# Patient Record
Sex: Male | Born: 1975 | Hispanic: No | Marital: Married | Smoking: Former smoker
Health system: Southern US, Community
[De-identification: ages and names within clinical notes are randomized; demographics above are authoritative.]

## PROBLEM LIST (undated history)

## (undated) DIAGNOSIS — T7840XA Allergy, unspecified, initial encounter: Secondary | ICD-10-CM

## (undated) DIAGNOSIS — R319 Hematuria, unspecified: Secondary | ICD-10-CM

## (undated) HISTORY — PX: HAND SURGERY: SHX662

## (undated) HISTORY — PX: WISDOM TOOTH EXTRACTION: SHX21

## (undated) HISTORY — DX: Hematuria, unspecified: R31.9

## (undated) HISTORY — DX: Allergy, unspecified, initial encounter: T78.40XA

---
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
## 2006-10-27 ENCOUNTER — Emergency Department (HOSPITAL_COMMUNITY): Admission: EM | Admit: 2006-10-27 | Discharge: 2006-10-28 | Payer: Self-pay | Admitting: Emergency Medicine

## 2008-04-08 ENCOUNTER — Ambulatory Visit: Payer: Self-pay | Admitting: Family Medicine

## 2008-05-08 ENCOUNTER — Ambulatory Visit: Payer: Self-pay | Admitting: Family Medicine
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
## 2009-12-22 ENCOUNTER — Ambulatory Visit: Payer: Self-pay | Admitting: Family Medicine

## 2010-02-10 ENCOUNTER — Ambulatory Visit
Admission: RE | Admit: 2010-02-10 | Discharge: 2010-02-10 | Payer: Self-pay | Source: Home / Self Care | Attending: Family Medicine | Admitting: Family Medicine

## 2010-05-21 ENCOUNTER — Ambulatory Visit (INDEPENDENT_AMBULATORY_CARE_PROVIDER_SITE_OTHER): Payer: BC Managed Care – PPO | Admitting: Family Medicine

## 2010-05-21 DIAGNOSIS — M719 Bursopathy, unspecified: Secondary | ICD-10-CM

## 2010-11-15 ENCOUNTER — Inpatient Hospital Stay (INDEPENDENT_AMBULATORY_CARE_PROVIDER_SITE_OTHER)
Admission: RE | Admit: 2010-11-15 | Discharge: 2010-11-15 | Disposition: A | Discharge status: Home / Self Care | Payer: BC Managed Care – PPO | Source: Ambulatory Visit | Attending: Family Medicine | Admitting: Family Medicine

## 2010-11-15 DIAGNOSIS — S91009A Unspecified open wound, unspecified ankle, initial encounter: Secondary | ICD-10-CM

## 2010-11-15 DIAGNOSIS — S61209A Unspecified open wound of unspecified finger without damage to nail, initial encounter: Secondary | ICD-10-CM

## 2011-02-08 HISTORY — PX: SHOULDER SURGERY: SHX246

## 2011-03-02 ENCOUNTER — Encounter: Payer: Self-pay | Admitting: Internal Medicine

## 2011-03-10 ENCOUNTER — Encounter: Payer: Self-pay | Admitting: Family Medicine

## 2011-03-10 ENCOUNTER — Ambulatory Visit (INDEPENDENT_AMBULATORY_CARE_PROVIDER_SITE_OTHER): Payer: BC Managed Care – PPO | Admitting: Family Medicine

## 2011-03-10 VITALS — BP 110/70 | HR 61 | Ht 73.0 in | Wt 195.0 lb

## 2011-03-10 DIAGNOSIS — J301 Allergic rhinitis due to pollen: Secondary | ICD-10-CM

## 2011-03-10 DIAGNOSIS — Z0000 Encounter for general adult medical examination without abnormal findings: Secondary | ICD-10-CM

## 2011-03-10 NOTE — Progress Notes (Signed)
  Subjective:    Patient ID: Mario Taylor, male    DOB: 09-04-1975, 36 y.o.   MRN: [id number]  HPI He is here for complete examination. He does have springtime allergies he treats with OTC medications. He has no other concerns or complaints. He is married and has 2 children, one from a previous marriage. His work is going well. He does exercise regularly playing basketball and does have some recent injuries was jammed fingers.   Review of Systems  Constitutional: Negative.   HENT: Negative.   Eyes: Negative.   Respiratory: Negative.   Cardiovascular: Negative.   Gastrointestinal: Negative.   Genitourinary: Negative.   Musculoskeletal: Negative.   Skin: Negative.   Neurological: Negative.   Hematological: Negative.   Psychiatric/Behavioral: Negative.        Objective:   Physical Exam BP 110/70  Pulse 61  Ht 6\' 1"  (1.854 m)  Wt 195 lb (88.451 kg)  BMI 25.73 kg/m2  General Appearance:    Alert, cooperative, no distress, appears stated age  Head:    Normocephalic, without obvious abnormality, atraumatic  Eyes:    PERRL, conjunctiva/corneas clear, EOM's intact, fundi    benign  Ears:    Normal TM's and external ear canals  Nose:   Nares normal, mucosa normal, no drainage or sinus   tenderness  Throat:   Lips, mucosa, and tongue normal; teeth and gums normal  Neck:   Supple, no lymphadenopathy;  thyroid:  no   enlargement/tenderness/nodules; no carotid   bruit or JVD  Back:    Spine nontender, no curvature, ROM normal, no CVA     tenderness  Lungs:     Clear to auscultation bilaterally without wheezes, rales or     ronchi; respirations unlabored  Chest Wall:    No tenderness or deformity   Heart:    Regular rate and rhythm, S1 and S2 normal, no murmur, rub   or gallop  Breast Exam:    No chest wall tenderness, masses or gynecomastia  Abdomen:     Soft, non-tender, nondistended, normoactive bowel sounds,    no masses, no hepatosplenomegaly  Genitalia:    Normal male  external genitalia without lesions.  Testicles without masses.  No inguinal hernias.  Rectal:   Deferred due to age <40 and lack of symptoms  Extremities:   No clubbing, cyanosis or edema  Pulses:   2+ and symmetric all extremities  Skin:   Skin color, texture, turgor normal, no rashes or lesions.tattoo noted on the right arm   Lymph nodes:   Cervical, supraclavicular, and axillary nodes normal  Neurologic:   CNII-XII intact, normal strength, sensation and gait; reflexes 2+ and symmetric throughout          Psych:   Normal mood, affect, hygiene and grooming.           Assessment & Plan:   1. Routine general medical examination at a health care facility   2. Allergic rhinitis due to pollen

## 2011-03-10 NOTE — Patient Instructions (Signed)
Keep taking good care of yourself and return here in 2 years

## 2011-04-18 ENCOUNTER — Ambulatory Visit (INDEPENDENT_AMBULATORY_CARE_PROVIDER_SITE_OTHER): Payer: BC Managed Care – PPO | Admitting: Family Medicine

## 2011-04-18 ENCOUNTER — Ambulatory Visit: Payer: BC Managed Care – PPO | Admitting: Family Medicine

## 2011-04-18 ENCOUNTER — Encounter: Payer: Self-pay | Admitting: Family Medicine

## 2011-04-18 VITALS — BP 120/70 | HR 66 | Temp 97.9°F | Ht 73.0 in | Wt 196.0 lb

## 2011-04-18 DIAGNOSIS — J019 Acute sinusitis, unspecified: Secondary | ICD-10-CM

## 2011-04-18 DIAGNOSIS — J301 Allergic rhinitis due to pollen: Secondary | ICD-10-CM

## 2011-04-18 MED ORDER — AMOXICILLIN 875 MG PO TABS: 875.0000 mg | ORAL_TABLET | Freq: Two times a day (BID) | ORAL | Status: AC

## 2011-04-18 MED ORDER — FLUTICASONE PROPIONATE 50 MCG/ACT NA SUSP: 2.0000 | Freq: Every day | NASAL | Status: AC

## 2011-04-18 NOTE — Progress Notes (Signed)
  Subjective:    Patient ID: Mario Taylor, male    DOB: January 12, 1976, 36 y.o.   MRN: [id number]  HPI For last 2 weeks he has had difficulty with sneezing and nasal congestion with some rhinorrhea. No itchy watery eyes. He does have underlying allergies and has been using Claritin-D for the last several weeks. No sore throat, earache, coughing. He is now experiencing some purulent nasal discharge. He has used nasal steroid sprays in the past.   Review of Systems     Objective:   Physical Exam alert and in no distress. Tympanic membranes and canals are normal. Throat is clear. Tonsils are normal. Neck is supple without adenopathy or thyromegaly. Cardiac exam shows a regular sinus rhythm without murmurs or gallops. Lungs are clear to auscultation. Nasal mucosa is normal however he is tender over frontal and axillary sinuses      Assessment & Plan:   1. Sinusitis acute  amoxicillin (AMOXIL) 875 MG tablet  2. Allergic rhinitis due to pollen  fluticasone (FLONASE) 50 MCG/ACT nasal spray   he is to call if not entirely better. Also recommend Afrin nasal spray at night.

## 2011-04-18 NOTE — Patient Instructions (Signed)
Take all the antibiotic and call me if not totally back to normal. Use Afrin nasal spray only at night

## 2011-09-12 ENCOUNTER — Encounter: Payer: Self-pay | Admitting: Medical

## 2011-09-12 ENCOUNTER — Ambulatory Visit (INDEPENDENT_AMBULATORY_CARE_PROVIDER_SITE_OTHER): Payer: BC Managed Care – PPO | Admitting: Medical

## 2011-09-12 VITALS — BP 110/80 | HR 68 | Temp 98.1°F | Resp 16 | Wt 194.0 lb

## 2011-09-12 DIAGNOSIS — L299 Pruritus, unspecified: Secondary | ICD-10-CM

## 2011-09-12 MED ORDER — HYDROXYZINE HCL 25 MG PO TABS: 25.0000 mg | ORAL_TABLET | Freq: Three times a day (TID) | ORAL | Status: AC | PRN

## 2011-09-12 NOTE — Patient Instructions (Signed)
For the itching: Begin Hydroxyzine for itching.  Take 1-2 tablets at bedtime.  During the day, use either 1/2 -1 tablet every 8 hours.   Caution - this can cause drowsiness.    You can continue Cortaid/hydrocortisone cream on the scalp twice daily.  Avoid hot showers.  You can use cool rag, ice pack, etc on the scalp.

## 2011-09-12 NOTE — Progress Notes (Signed)
Subjective: Went to [facility] 2 weeks ago for face rash.   Was treated for shingles, given antiviral and prednisone.  The rash started to clear up but is itching "like there is no tomorrow."  Using anti itch cream currently on the scalp.  The scalp is just very itchy.  Otherwise feels fine.  The shingles rash cleared.  He did go to ophthalmology and they checked things out too.   No other aggravating or relieving factors.   He has questions about his wife.  She recently had a baby, and they just recently started back having sex.   She has gotten several UTIs recently thought, and he wants to know what he can do to avoid her getting UTIs.  No other c/o.  The following portions of the patient's history were reviewed and updated as appropriate: allergies, current medications, past family history, past medical history, past social history, past surgical history and problem list.  Past Medical History  Diagnosis Date  . Allergy     No Known Allergies   Review of Systems ROS reviewed and was negative other than noted in HPI or above.    Objective:   Physical Exam  General appearance: alert, no distress, WD/WN Scalp: no obvious rash on scalp of face Heent: face normal appearing, no rash, eyes normal appearing, no vesicles on head or scalp  Assessment and Plan :     Encounter Diagnosis  Name Primary?  . Pruritic condition Yes   Begin Hydroxyzine, 25mg , up to TID, can use double dose at bedtime.  C/t Cortaid on the scalp, ice pack, cool rag, and avoid hot showers.  If not improving or worse in the next 3-5 days, let me know.    Counseled on hygiene and prevention of UTI in regards to his questions.

## 2015-04-27 ENCOUNTER — Ambulatory Visit: Payer: Self-pay | Admitting: Allergy and Immunology

## 2016-04-08 DIAGNOSIS — R0602 Shortness of breath: Secondary | ICD-10-CM | POA: Diagnosis not present

## 2016-04-13 ENCOUNTER — Institutional Professional Consult (permissible substitution): Payer: Self-pay | Admitting: Internal Medicine

## 2016-05-09 DIAGNOSIS — S93501A Unspecified sprain of right great toe, initial encounter: Secondary | ICD-10-CM | POA: Diagnosis not present

## 2016-05-10 DIAGNOSIS — S9031XA Contusion of right foot, initial encounter: Secondary | ICD-10-CM | POA: Diagnosis not present

## 2016-05-10 DIAGNOSIS — M79671 Pain in right foot: Secondary | ICD-10-CM | POA: Diagnosis not present

## 2016-06-08 ENCOUNTER — Encounter: Payer: Self-pay | Admitting: Internal Medicine

## 2016-06-08 ENCOUNTER — Ambulatory Visit (INDEPENDENT_AMBULATORY_CARE_PROVIDER_SITE_OTHER)
Admission: RE | Admit: 2016-06-08 | Discharge: 2016-06-08 | Disposition: A | Discharge status: Home / Self Care | Payer: 59 | Source: Ambulatory Visit | Attending: Internal Medicine | Admitting: Internal Medicine

## 2016-06-08 ENCOUNTER — Ambulatory Visit (INDEPENDENT_AMBULATORY_CARE_PROVIDER_SITE_OTHER): Payer: 59 | Admitting: Internal Medicine

## 2016-06-08 ENCOUNTER — Other Ambulatory Visit (INDEPENDENT_AMBULATORY_CARE_PROVIDER_SITE_OTHER): Payer: 59

## 2016-06-08 VITALS — BP 122/80 | HR 74 | Ht 72.0 in | Wt 204.6 lb

## 2016-06-08 DIAGNOSIS — R0602 Shortness of breath: Secondary | ICD-10-CM

## 2016-06-08 LAB — CBC WITH DIFFERENTIAL/PLATELET
BASOS ABS: 0.1 10*3/uL (ref 0.0–0.1)
Basophils Relative: 1 % (ref 0.0–3.0)
EOS ABS: 0.4 10*3/uL (ref 0.0–0.7)
Eosinophils Relative: 6.8 % — ABNORMAL HIGH (ref 0.0–5.0)
HCT: 42.5 % (ref 39.0–52.0)
HEMOGLOBIN: 15.1 g/dL (ref 13.0–17.0)
LYMPHS PCT: 19.6 % (ref 12.0–46.0)
Lymphs Abs: 1 10*3/uL (ref 0.7–4.0)
MCHC: 35.5 g/dL (ref 30.0–36.0)
MCV: 85.4 fl (ref 78.0–100.0)
MONO ABS: 0.7 10*3/uL (ref 0.1–1.0)
Monocytes Relative: 12.7 % — ABNORMAL HIGH (ref 3.0–12.0)
Neutro Abs: 3.2 10*3/uL (ref 1.4–7.7)
Neutrophils Relative %: 59.9 % (ref 43.0–77.0)
Platelets: 222 10*3/uL (ref 150.0–400.0)
RBC: 4.97 Mil/uL (ref 4.22–5.81)
RDW: 13.1 % (ref 11.5–15.5)
WBC: 5.3 10*3/uL (ref 4.0–10.5)

## 2016-06-08 LAB — NITRIC OXIDE: NITRIC OXIDE: 22

## 2016-06-08 NOTE — Assessment & Plan Note (Addendum)
Unexplained. Thought might be asthma but nitric oxide test is normal.   Plan Let us do screening cxr and pft test and cbc with diff  followup  call us after pft test for review of result; based on that expectant followup v return to office v do more tests

## 2016-06-08 NOTE — Progress Notes (Signed)
Subjective:    Patient ID: Mario Taylor, male    DOB: 10-30-1975, 41 y.o.   MRN: [id number]   PCP Tawanna Solo, MD  HPI  IOV 06/08/2016  Chief Complaint  Patient presents with  . Pulmonary Consult    Pt referred by Dr. Lynelle Doctor for SOB x 2-3 months. Pt states the SOB would occur with activity and with rest. Pt denies CP/tihgtness and cough and f/c/s.     41 year old male. 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 In addition he has a long history of perineal allergies made worse in the spring for which she is on Zyrtec all year round with occasional need to Singulair. His allergies do get worse in the head and neck area in the spring once a year requiring systemic steroids although this year in 2018 that has not been the case. He normally plays sports basketball and softball. Earlier this year he transitioned to weight lifting. Then in February 2018 he developed insidious onset of shortness of breath unexplained without any preceding illness or symptoms. Shortness of breath was random and at times severe. It did not wake him up in the middle of the night. His chest will feel heavy. Dr. at rest. He says he would sit it out and it would get better in 5 or 10 minutes. No nocturnal symptoms. No associated cough wheezing or chest pain or diaphoresis. Then spontaneously in April 2018 symptoms have resolved. Currently he is asymptomatic. Exhaled nitric oxide today in the office is normal at 22 ppb    has a past medical history of Allergy and Hematuria.   reports that he quit smoking about 11 years ago. His smoking use included Cigarettes. He has a 2.50 pack-year smoking history. He has never used smokeless tobacco.  Past Surgical History:  Procedure Laterality Date  . FOOT SURGERY  1998  . HAND SURGERY Right   . SHOULDER SURGERY Right 2013   x2   . WISDOM TOOTH EXTRACTION      No Known Allergies  Immunization History  Administered Date(s) Administered    . Influenza,inj,Quad PF,36+ Mos 11/08/2015    No family history on file.   Current Outpatient Prescriptions:  .  cetirizine (ZYRTEC) 10 MG tablet, Take 10 mg by mouth daily as needed for allergies., Disp: , Rfl:  .  fluticasone (FLONASE) 50 MCG/ACT nasal spray, Place into both nostrils daily as needed for allergies or rhinitis., Disp: , Rfl:  .  montelukast (SINGULAIR) 10 MG tablet, Take 10 mg by mouth daily as needed., Disp: , Rfl:  .  pseudoephedrine (SUDAFED) 30 MG tablet, Take 30 mg by mouth every 4 (four) hours as needed for congestion., Disp: , Rfl:    Review of Systems  Constitutional: Negative for fever and unexpected weight change.  HENT: Negative for congestion, dental problem, ear pain, nosebleeds, postnasal drip, rhinorrhea, sinus pressure, sneezing, sore throat and trouble swallowing.   Eyes: Negative for redness and itching.  Respiratory: Positive for shortness of breath. Negative for cough, chest tightness and wheezing.   Cardiovascular: Negative for palpitations and leg swelling.  Gastrointestinal: Negative for nausea and vomiting.  Genitourinary: Negative for dysuria.  Musculoskeletal: Negative for joint swelling.  Skin: Negative for rash.  Neurological: Negative for headaches.  Hematological: Does not bruise/bleed easily.  Psychiatric/Behavioral: Negative for dysphoric mood. The patient is not nervous/anxious.        Objective:   Physical Exam  Constitutional: He is oriented to  person, place, and time. He appears well-developed and well-nourished. No distress.  HENT:  Head: Normocephalic and atraumatic.  Right Ear: External ear normal.  Left Ear: External ear normal.  Mouth/Throat: Oropharynx is clear and moist. No oropharyngeal exudate.  Eyes: Conjunctivae and EOM are normal. Pupils are equal, round, and reactive to light. Right eye exhibits no discharge. Left eye exhibits no discharge. No scleral icterus.  Neck: Normal range of motion. Neck supple. No JVD  present. No tracheal deviation present. No thyromegaly present.  Cardiovascular: Normal rate, regular rhythm and intact distal pulses.  Exam reveals no gallop and no friction rub.   No murmur heard. Pulmonary/Chest: Effort normal and breath sounds normal. No respiratory distress. He has no wheezes. He has no rales. He exhibits no tenderness.  Abdominal: Soft. Bowel sounds are normal. He exhibits no distension and no mass. There is no tenderness. There is no rebound and no guarding.  Musculoskeletal: Normal range of motion. He exhibits no edema or tenderness.  Lymphadenopathy:    He has no cervical adenopathy.  Neurological: He is alert and oriented to person, place, and time. He has normal reflexes. No cranial nerve deficit. Coordination normal.  Skin: Skin is warm and dry. No rash noted. He is not diaphoretic. No erythema. No pallor.  Psychiatric: He has a normal mood and affect. His behavior is normal. Judgment and thought content normal.  Nursing note and vitals reviewed.  Vitals:   06/08/16 0910  BP: 122/80  Pulse: 74  SpO2: 98%  Weight: 204 lb 9.6 oz (92.8 kg)  Height: 6' (1.829 m)    Estimated body mass index is 27.75 kg/m as calculated from the following:   Height as of this encounter: 6' (1.829 m).   Weight as of this encounter: 204 lb 9.6 oz (92.8 kg).        Assessment & Plan:     ICD-9-CM ICD-10-CM   1. Shortness of breath 786.05 R06.02 Nitric oxide     DG Chest 2 View     Pulmonary function test     CBC w/Diff    Shortness of breath Unexplained. Thought might be asthma but nitric oxide test is normal.   Plan Let us do screening cxr and pft test and cbc with diff  followup  call us after pft test for review of result; based on that expectant followup v return to office v do more tests    Dr. Brand Males, M.D., [facility].C.P Pulmonary and Critical Care Medicine Staff Physician [facility] Pager: [phone],  If no answer or between  15:00h - 7:00h: call [phone]  06/08/2016 9:35 AM

## 2016-06-08 NOTE — Patient Instructions (Addendum)
ICD-9-CM ICD-10-CM   1. Shortness of breath 786.05 R06.02 Nitric oxide     DG Chest 2 View     Pulmonary function test   Shortness of breath Unexplained. Thought might be asthma but nitric oxide test is normal.   Plan Let us do screening cxr and pft test and cbc with diff  followup  call us after pft test for review of result; based on that expectant followup v return to office v do more tests

## 2016-06-10 ENCOUNTER — Telehealth: Payer: Self-pay | Admitting: Internal Medicine

## 2016-06-10 NOTE — Telephone Encounter (Signed)
  Mario Taylor   Let Mario Taylor know that cxr clear  But eos is HIGH. This suggests active allergies wheich we know about. His dyspnea back 2 mo ago might be allergies or possible asthma but currently fine so we just follow expectantly and he reports to [place] if this recurs. It might be a good idea for him to talk to his PCP Tawanna Solo, MD and get allergy referral  PLease send this note to PCP    Thanks  Dr. Brand Males, M.D., [facility].C.P Pulmonary and Critical Care Medicine Staff Physician [place] [facility] Pager: [phone], If no answer or between  15:00h - 7:00h: call [phone]  06/10/2016 8:21 AM       PULMONARY No results for input(s): PHART, PCO2ART, PO2ART, HCO3, TCO2, O2SAT in the last 168 hours.  Invalid input(s): PCO2, PO2  CBC  Recent Labs Lab 06/08/16 0944  HGB 15.1  HCT 42.5  WBC 5.3  PLT 222.0   Results for Mario, Taylor (MRN [id number]) as of 06/10/2016 08:14  Ref. Range 06/08/2016 09:26 06/08/2016 09:44  Eosinophils Absolute Latest Ref Range: 0.0 - 0.7 K/uL  0.4  Results for Mario, Taylor (MRN [id number]) as of 06/10/2016 08:14  Ref. Range 06/08/2016 09:26 06/08/2016 09:44  Eosinophil Latest Ref Range: 0.0 - 5.0 %  6.8 (H)   COAGULATION No results for input(s): INR in the last 168 hours.  CARDIAC  No results for input(s): TROPONINI in the last 168 hours. No results for input(s): PROBNP in the last 168 hours.   CHEMISTRY No results for input(s): NA, K, CL, CO2, GLUCOSE, BUN, CREATININE, CALCIUM, MG, PHOS in the last 168 hours. CrCl cannot be calculated (No order found.).   LIVER No results for input(s): AST, ALT, ALKPHOS, BILITOT, PROT, ALBUMIN, INR in the last 168 hours.   INFECTIOUS No results for input(s): LATICACIDVEN, PROCALCITON in the last 168 hours.   ENDOCRINE CBG (last 3)  No results for input(s): GLUCAP in the last 72 hours.       IMAGING x48h  - image(s) personally  visualized  -   highlighted in bold Dg Chest 2 View  Result Date: 06/08/2016 CLINICAL DATA:  Short of breath EXAM: CHEST  2 VIEW COMPARISON:  None. FINDINGS: The heart size and mediastinal contours are within normal limits. Both lungs are clear. The visualized skeletal structures are unremarkable. IMPRESSION: No active cardiopulmonary disease. Electronically Signed   By: Franchot Gallo M.D.   On: 06/08/2016 10:18

## 2016-06-14 NOTE — Telephone Encounter (Signed)
lmtcb for pt.  

## 2016-06-16 NOTE — Telephone Encounter (Signed)
Patient returned call [phone].  Advised him that his VM is full also.

## 2016-06-16 NOTE — Telephone Encounter (Signed)
ATC pt. VM box is full. WCB.  

## 2016-06-16 NOTE — Telephone Encounter (Signed)
Spoke with pt and made him aware of MR's message. Pt understood and will follow up with his PCP. Per MR this note has been sent to PCP. Pt had no further questions at this time. Nothing further is needed

## 2016-06-20 ENCOUNTER — Ambulatory Visit (INDEPENDENT_AMBULATORY_CARE_PROVIDER_SITE_OTHER): Payer: 59 | Admitting: Internal Medicine

## 2016-06-20 DIAGNOSIS — R0602 Shortness of breath: Secondary | ICD-10-CM

## 2016-06-20 NOTE — Progress Notes (Signed)
PFT completed today.[name],CMA  

## 2016-09-16 DIAGNOSIS — S80811A Abrasion, right lower leg, initial encounter: Secondary | ICD-10-CM | POA: Diagnosis not present

## 2016-09-16 DIAGNOSIS — L237 Allergic contact dermatitis due to plants, except food: Secondary | ICD-10-CM | POA: Diagnosis not present

## 2016-09-22 DIAGNOSIS — L309 Dermatitis, unspecified: Secondary | ICD-10-CM | POA: Diagnosis not present

## 2016-09-23 DIAGNOSIS — M79661 Pain in right lower leg: Secondary | ICD-10-CM | POA: Diagnosis not present

## 2016-09-23 DIAGNOSIS — M79671 Pain in right foot: Secondary | ICD-10-CM | POA: Diagnosis not present

## 2016-09-30 DIAGNOSIS — M79671 Pain in right foot: Secondary | ICD-10-CM | POA: Diagnosis not present

## 2016-10-05 DIAGNOSIS — S90229A Contusion of unspecified lesser toe(s) with damage to nail, initial encounter: Secondary | ICD-10-CM | POA: Diagnosis not present

## 2016-10-05 DIAGNOSIS — S99922A Unspecified injury of left foot, initial encounter: Secondary | ICD-10-CM | POA: Diagnosis not present

## 2016-10-07 DIAGNOSIS — M79671 Pain in right foot: Secondary | ICD-10-CM | POA: Diagnosis not present

## 2016-10-07 DIAGNOSIS — M21611 Bunion of right foot: Secondary | ICD-10-CM | POA: Diagnosis not present

## 2016-10-28 ENCOUNTER — Other Ambulatory Visit: Payer: Self-pay | Admitting: Orthopedic Surgery

## 2016-11-08 DIAGNOSIS — Z23 Encounter for immunization: Secondary | ICD-10-CM | POA: Diagnosis not present

## 2016-11-24 ENCOUNTER — Encounter (HOSPITAL_BASED_OUTPATIENT_CLINIC_OR_DEPARTMENT_OTHER): Payer: Self-pay | Admitting: *Deleted

## 2016-12-01 ENCOUNTER — Encounter (HOSPITAL_BASED_OUTPATIENT_CLINIC_OR_DEPARTMENT_OTHER)
Admission: RE | Disposition: A | Discharge status: Home / Self Care | Payer: Self-pay | Source: Ambulatory Visit | Attending: Orthopedic Surgery

## 2016-12-01 ENCOUNTER — Ambulatory Visit (HOSPITAL_BASED_OUTPATIENT_CLINIC_OR_DEPARTMENT_OTHER): Payer: 59 | Admitting: Anesthesiology

## 2016-12-01 ENCOUNTER — Encounter (HOSPITAL_BASED_OUTPATIENT_CLINIC_OR_DEPARTMENT_OTHER): Payer: Self-pay | Admitting: Emergency Medicine

## 2016-12-01 ENCOUNTER — Ambulatory Visit (HOSPITAL_BASED_OUTPATIENT_CLINIC_OR_DEPARTMENT_OTHER)
Admission: RE | Admit: 2016-12-01 | Discharge: 2016-12-01 | Disposition: A | Discharge status: Home / Self Care | Payer: 59 | Source: Ambulatory Visit | Attending: Orthopedic Surgery | Admitting: Orthopedic Surgery

## 2016-12-01 DIAGNOSIS — R0602 Shortness of breath: Secondary | ICD-10-CM | POA: Diagnosis not present

## 2016-12-01 DIAGNOSIS — Y9364 Activity, baseball: Secondary | ICD-10-CM | POA: Diagnosis not present

## 2016-12-01 DIAGNOSIS — G8918 Other acute postprocedural pain: Secondary | ICD-10-CM | POA: Diagnosis not present

## 2016-12-01 DIAGNOSIS — X58XXXA Exposure to other specified factors, initial encounter: Secondary | ICD-10-CM | POA: Diagnosis not present

## 2016-12-01 DIAGNOSIS — Z87891 Personal history of nicotine dependence: Secondary | ICD-10-CM | POA: Insufficient documentation

## 2016-12-01 DIAGNOSIS — M205X1 Other deformities of toe(s) (acquired), right foot: Secondary | ICD-10-CM | POA: Diagnosis not present

## 2016-12-01 DIAGNOSIS — M21611 Bunion of right foot: Secondary | ICD-10-CM | POA: Insufficient documentation

## 2016-12-01 DIAGNOSIS — S948X1A Injury of other nerves at ankle and foot level, right leg, initial encounter: Secondary | ICD-10-CM | POA: Diagnosis not present

## 2016-12-01 DIAGNOSIS — S93501A Unspecified sprain of right great toe, initial encounter: Secondary | ICD-10-CM | POA: Diagnosis not present

## 2016-12-01 DIAGNOSIS — Q6689 Other  specified congenital deformities of feet: Secondary | ICD-10-CM | POA: Diagnosis not present

## 2016-12-01 DIAGNOSIS — M79671 Pain in right foot: Secondary | ICD-10-CM | POA: Diagnosis present

## 2016-12-01 HISTORY — PX: METATARSAL OSTEOTOMY WITH BUNIONECTOMY: SHX5662

## 2016-12-01 HISTORY — PX: LIGAMENT REPAIR: SHX5444

## 2016-12-01 SURGERY — BUNIONECTOMY, WITH METATARSAL OSTEOTOMY
Anesthesia: General | Site: Foot | Laterality: Right

## 2016-12-01 MED ORDER — PROMETHAZINE HCL 25 MG/ML IJ SOLN
6.2500 mg | Freq: Once | INTRAMUSCULAR | Status: AC | PRN
  Administered 2016-12-01: 6.25 mg via INTRAVENOUS

## 2016-12-01 MED ORDER — MIDAZOLAM HCL 2 MG/2ML IJ SOLN
1.0000 mg | INTRAMUSCULAR | Status: DC | PRN
  Administered 2016-12-01 (×2): 2 mg via INTRAVENOUS

## 2016-12-01 MED ORDER — SODIUM CHLORIDE 0.9 % IJ SOLN
INTRAMUSCULAR | Status: AC
  Filled 2016-12-01: qty 10

## 2016-12-01 MED ORDER — MIDAZOLAM HCL 2 MG/2ML IJ SOLN
INTRAMUSCULAR | Status: AC
  Filled 2016-12-01: qty 2

## 2016-12-01 MED ORDER — ONDANSETRON HCL 4 MG/2ML IJ SOLN: 4.0000 mg | Freq: Once | INTRAMUSCULAR | Status: DC | PRN

## 2016-12-01 MED ORDER — ROPIVACAINE HCL 7.5 MG/ML IJ SOLN
INTRAMUSCULAR | Status: DC | PRN
Start: 2016-12-01 — End: 2016-12-01
  Administered 2016-12-01: 30 mL via PERINEURAL

## 2016-12-01 MED ORDER — PROMETHAZINE HCL 25 MG/ML IJ SOLN
INTRAMUSCULAR | Status: AC
  Filled 2016-12-01: qty 1

## 2016-12-01 MED ORDER — DEXAMETHASONE SODIUM PHOSPHATE 10 MG/ML IJ SOLN
INTRAMUSCULAR | Status: AC
  Filled 2016-12-01: qty 1

## 2016-12-01 MED ORDER — CEFAZOLIN SODIUM-DEXTROSE 2-3 GM-%(50ML) IV SOLR
INTRAVENOUS | Status: DC | PRN
  Administered 2016-12-01: 2 g via INTRAVENOUS

## 2016-12-01 MED ORDER — SCOPOLAMINE 1 MG/3DAYS TD PT72: 1.0000 | MEDICATED_PATCH | Freq: Once | TRANSDERMAL | Status: DC | PRN

## 2016-12-01 MED ORDER — FENTANYL CITRATE (PF) 100 MCG/2ML IJ SOLN: 25.0000 ug | INTRAMUSCULAR | Status: DC | PRN

## 2016-12-01 MED ORDER — LACTATED RINGERS IV SOLN
INTRAVENOUS | Status: DC
  Administered 2016-12-01 (×2): via INTRAVENOUS

## 2016-12-01 MED ORDER — CEFAZOLIN SODIUM-DEXTROSE 2-4 GM/100ML-% IV SOLN: 2.0000 g | INTRAVENOUS | Status: DC

## 2016-12-01 MED ORDER — ONDANSETRON HCL 4 MG/2ML IJ SOLN
INTRAMUSCULAR | Status: AC
  Filled 2016-12-01: qty 2

## 2016-12-01 MED ORDER — CHLORHEXIDINE GLUCONATE 4 % EX LIQD: 60.0000 mL | Freq: Once | CUTANEOUS | Status: DC

## 2016-12-01 MED ORDER — FENTANYL CITRATE (PF) 100 MCG/2ML IJ SOLN
INTRAMUSCULAR | Status: AC
  Filled 2016-12-01: qty 2

## 2016-12-01 MED ORDER — FENTANYL CITRATE (PF) 100 MCG/2ML IJ SOLN
50.0000 ug | INTRAMUSCULAR | Status: DC | PRN
  Administered 2016-12-01: 100 ug via INTRAVENOUS

## 2016-12-01 MED ORDER — CEFAZOLIN SODIUM-DEXTROSE 2-4 GM/100ML-% IV SOLN
INTRAVENOUS | Status: AC
  Filled 2016-12-01: qty 100

## 2016-12-01 MED ORDER — SODIUM CHLORIDE 0.9 % IV SOLN: INTRAVENOUS | Status: DC

## 2016-12-01 MED ORDER — MEPERIDINE HCL 25 MG/ML IJ SOLN: 6.2500 mg | INTRAMUSCULAR | Status: DC | PRN

## 2016-12-01 MED ORDER — ACETAMINOPHEN 10 MG/ML IV SOLN
1000.0000 mg | Freq: Four times a day (QID) | INTRAVENOUS | Status: DC
  Administered 2016-12-01: 1000 mg via INTRAVENOUS

## 2016-12-01 MED ORDER — ACETAMINOPHEN 10 MG/ML IV SOLN
INTRAVENOUS | Status: AC
  Filled 2016-12-01: qty 100

## 2016-12-01 MED ORDER — LIDOCAINE 2% (20 MG/ML) 5 ML SYRINGE
INTRAMUSCULAR | Status: AC
  Filled 2016-12-01: qty 5

## 2016-12-01 SURGICAL SUPPLY — 81 items
BANDAGE ESMARK 6X9 LF (GAUZE/BANDAGES/DRESSINGS) IMPLANT
BIT DRILL 1.5X30 QC DISP (BIT) ×3 IMPLANT
BLADE AVERAGE 25MMX9MM (BLADE) ×1
BLADE AVERAGE 25X9 (BLADE) ×2 IMPLANT
BLADE LONG MED 25X9 (BLADE) IMPLANT
BLADE LONG MED 25X9MM (BLADE)
BLADE MICRO SAGITTAL (BLADE) IMPLANT
BLADE SURG 15 STRL LF DISP TIS (BLADE) ×3 IMPLANT
BLADE SURG 15 STRL SS (BLADE) ×6
BNDG COHESIVE 4X5 TAN STRL (GAUZE/BANDAGES/DRESSINGS) ×3 IMPLANT
BNDG COHESIVE 6X5 TAN STRL LF (GAUZE/BANDAGES/DRESSINGS) IMPLANT
BNDG CONFORM 3 STRL LF (GAUZE/BANDAGES/DRESSINGS) ×3 IMPLANT
BNDG ESMARK 6X9 LF (GAUZE/BANDAGES/DRESSINGS)
CANISTER SUCT 1200ML W/VALVE (MISCELLANEOUS) ×3 IMPLANT
CHLORAPREP W/TINT 26ML (MISCELLANEOUS) ×3 IMPLANT
COVER BACK TABLE 60X90IN (DRAPES) ×3 IMPLANT
CUFF TOURNIQUET SINGLE 24IN (TOURNIQUET CUFF) IMPLANT
CUFF TOURNIQUET SINGLE 34IN LL (TOURNIQUET CUFF) ×3 IMPLANT
DECANTER SPIKE VIAL GLASS SM (MISCELLANEOUS) IMPLANT
DRAPE EXTREMITY T 121X128X90 (DRAPE) ×3 IMPLANT
DRAPE OEC MINIVIEW 54X84 (DRAPES) ×3 IMPLANT
DRAPE U-SHAPE 47X51 STRL (DRAPES) ×3 IMPLANT
DRSG MEPITEL 4X7.2 (GAUZE/BANDAGES/DRESSINGS) ×3 IMPLANT
DRSG PAD ABDOMINAL 8X10 ST (GAUZE/BANDAGES/DRESSINGS) ×3 IMPLANT
ELECT REM PT RETURN 9FT ADLT (ELECTROSURGICAL) ×3
ELECTRODE REM PT RTRN 9FT ADLT (ELECTROSURGICAL) ×1 IMPLANT
GAUZE SPONGE 4X4 12PLY STRL (GAUZE/BANDAGES/DRESSINGS) ×3 IMPLANT
GLOVE BIO SURGEON STRL SZ8 (GLOVE) ×3 IMPLANT
GLOVE BIOGEL PI IND STRL 8 (GLOVE) ×2 IMPLANT
GLOVE BIOGEL PI INDICATOR 8 (GLOVE) ×4
GLOVE ECLIPSE 8.0 STRL XLNG CF (GLOVE) ×6 IMPLANT
GOWN STRL REUS W/ TWL LRG LVL3 (GOWN DISPOSABLE) ×1 IMPLANT
GOWN STRL REUS W/ TWL XL LVL3 (GOWN DISPOSABLE) ×2 IMPLANT
GOWN STRL REUS W/TWL LRG LVL3 (GOWN DISPOSABLE) ×2
GOWN STRL REUS W/TWL XL LVL3 (GOWN DISPOSABLE) ×4
GUIDEWIRE .08 (WIRE) IMPLANT
K-WIRE .054X4 (WIRE) IMPLANT
K-WIRE .062X4 (WIRE) IMPLANT
NDL SUT 6 .5 CRC .975X.05 MAYO (NEEDLE) IMPLANT
NEEDLE HYPO 22GX1.5 SAFETY (NEEDLE) IMPLANT
NEEDLE HYPO 25X1 1.5 SAFETY (NEEDLE) IMPLANT
NEEDLE MAYO TAPER (NEEDLE)
NS IRRIG 1000ML POUR BTL (IV SOLUTION) ×3 IMPLANT
PACK BASIN DAY SURGERY FS (CUSTOM PROCEDURE TRAY) ×3 IMPLANT
PAD CAST 4YDX4 CTTN HI CHSV (CAST SUPPLIES) ×1 IMPLANT
PADDING CAST ABS 4INX4YD NS (CAST SUPPLIES)
PADDING CAST ABS COTTON 4X4 ST (CAST SUPPLIES) IMPLANT
PADDING CAST COTTON 4X4 STRL (CAST SUPPLIES) ×2
PADDING CAST COTTON 6X4 STRL (CAST SUPPLIES) IMPLANT
PASSER SUT SWANSON 36MM LOOP (INSTRUMENTS) IMPLANT
PENCIL BUTTON HOLSTER BLD 10FT (ELECTRODE) ×3 IMPLANT
RETRIEVER SUT HEWSON (MISCELLANEOUS) IMPLANT
SANITIZER HAND PURELL 535ML FO (MISCELLANEOUS) ×3 IMPLANT
SCREW 2.0MM CORTICAL FT 22MM (Screw) ×3 IMPLANT
SCREW CORTICAL 2.0X18MM (Screw) ×3 IMPLANT
SHEET MEDIUM DRAPE 40X70 STRL (DRAPES) ×3 IMPLANT
SLEEVE SCD COMPRESS KNEE MED (MISCELLANEOUS) ×3 IMPLANT
SPLINT FAST PLASTER 5X30 (CAST SUPPLIES)
SPLINT PLASTER CAST FAST 5X30 (CAST SUPPLIES) IMPLANT
SPONGE LAP 18X18 X RAY DECT (DISPOSABLE) ×3 IMPLANT
STOCKINETTE 6  STRL (DRAPES) ×2
STOCKINETTE 6 STRL (DRAPES) ×1 IMPLANT
SUCTION FRAZIER HANDLE 10FR (MISCELLANEOUS) ×2
SUCTION TUBE FRAZIER 10FR DISP (MISCELLANEOUS) ×1 IMPLANT
SUT ETHIBOND 0 MO6 C/R (SUTURE) IMPLANT
SUT ETHIBOND 2 OS 4 DA (SUTURE) ×3 IMPLANT
SUT ETHIBOND 3-0 V-5 (SUTURE) IMPLANT
SUT ETHILON 3 0 PS 1 (SUTURE) ×3 IMPLANT
SUT FIBERWIRE 2-0 18 17.9 3/8 (SUTURE)
SUT MNCRL AB 3-0 PS2 18 (SUTURE) ×3 IMPLANT
SUT VIC AB 0 SH 27 (SUTURE) IMPLANT
SUT VIC AB 2-0 SH 27 (SUTURE) ×4
SUT VIC AB 2-0 SH 27XBRD (SUTURE) ×2 IMPLANT
SUT VICRYL 0 UR6 27IN ABS (SUTURE) IMPLANT
SUTURE FIBERWR 2-0 18 17.9 3/8 (SUTURE) IMPLANT
SYR BULB 3OZ (MISCELLANEOUS) ×3 IMPLANT
SYR CONTROL 10ML LL (SYRINGE) IMPLANT
TOWEL OR 17X24 6PK STRL BLUE (TOWEL DISPOSABLE) ×3 IMPLANT
TUBE CONNECTING 20'X1/4 (TUBING) ×1
TUBE CONNECTING 20X1/4 (TUBING) ×2 IMPLANT
UNDERPAD 30X30 (UNDERPADS AND DIAPERS) ×3 IMPLANT

## 2016-12-01 NOTE — Op Note (Signed)
12/01/2016  1:17 PM  PATIENT:  Mario Taylor  41 y.o. male  PRE-OPERATIVE DIAGNOSIS:  Traumatic right bunion with plantar plate injury at medial sesamoid   POST-OPERATIVE DIAGNOSIS:  Traumatic right bunion with plantar plate injury at medial sesamoid   Procedure(s): 1.  Right modified McBride bunionectomy 2.  Right 1st metatarsal SCARF osteotomy 3.  Repair of right hallux medial collateral ligament  SURGEON:  Wylene Simmer, MD  ASSISTANT: none  ANESTHESIA:   General, regional  EBL:  minimal   TOURNIQUET:   Total Tourniquet Time Documented: Thigh (Right) - 46 minutes Total: Thigh (Right) - 46 minutes   COMPLICATIONS:  None apparent  DISPOSITION:  Extubated, awake and stable to recovery.  INDICATION FOR PROCEDURE: The patient is a 41 year old male who injured his right forefoot playing softball in the spring of this year.  He has had continued pain as well as progression of a traumatic bunion deformity.  MRI of the forefoot shows a partial plantar plate tear as well.  He has failed nonoperative treatment to date including activity modification, shoewear modification, toe splinting and taping as well as oral anti-inflammatory medications.  He presents now for operative treatment of this painful condition.  The risks and benefits of the alternative treatment options have been discussed in detail.  The patient wishes to proceed with surgery and specifically understands risks of bleeding, infection, nerve damage, blood clots, need for additional surgery, amputation and death.  PROCEDURE IN DETAIL:  After pre operative consent was obtained, and the correct operative site was identified, the patient was brought to the operating room and placed supine on the OR table.  Anesthesia was administered.  Pre-operative antibiotics were administered.  A surgical timeout was taken.  The right lower extremity was then prepped and draped in standard sterile fashion with a tourniquet around the thigh.   The extremity was exsanguinated and the tourniquet was inflated to 250 mmHg.  A longitudinal incision was then made over the dorsum of the first webspace.  Dissection was carried down through the skin and subcutaneous tissue.  The intermetatarsal ligament was divided under direct vision.  An arthrotomy was then made between the lateral sesamoid and the metatarsal head.  Small perforations were made in the lateral joint capsule.  The hallux could then be positioned in 20 degrees of varus passively.  Attention was then turned to the medial forefoot where a longitudinal incision was made over the medial eminence.  Dissection was carried down through the skin and subcutaneous tissue.  The medial joint capsule was incised and elevated plantarly and dorsally.  Hypertrophic medial eminence was resected in line with the metatarsal shaft.  K wires were then placed in the metatarsal to mark the corners of a scarf osteotomy.  The osteotomy was then made with an oscillating saw.  A small wedge of bone was removed proximally.  The head of the metatarsal was translated laterally to correct the hallux valgus and intermetatarsal angles.  The osteotomy was fixed with 2 Biomet 2.0 mm mini frag screws.  Overhanging bone was trimmed with an oscillating saw after x-rays revealed appropriate correction of the hallux valgus and intermetatarsal angles.  The wound was irrigated copiously.  The medial collateral ligament and plantar plate were then carefully inspected distal to the medial sesamoid.  There was a small tear in the tendon in this area.  This was debrided with a scalpel.  2-0 Ethibond sutures were used to repair the tear.  The medial joint capsule was  then repaired with imbricating sutures of 2-0 Vicryl.  Subtendinous tissues were approximated with Monocryl.  Both skin incisions were closed with running 3-0 nylon.  Sterile dressings were applied followed by a bunion wrap.  Tourniquet was released after application of the  dressings.  The patient was awakened from anesthesia and transported to the recovery room in stable condition.   FOLLOW UP PLAN: Patient will bear weight as tolerated on his heel in a Darco wedge style shoe.  He will follow-up with me in 2 weeks for suture removal.  We will plan to continue taping and splinting the toe for the first 6 weeks after surgery.   RADIOGRAPHS: AP and lateral radiographs of the right foot are obtained intraoperatively.  These show interval correction of the traumatic bunion deformity.  Hardware is appropriately positioned of the appropriate lengths.  No other acute injuries are noted.

## 2016-12-01 NOTE — Anesthesia Procedure Notes (Signed)
Procedure Name: LMA Insertion Performed by: Verita Lamb Pre-anesthesia Checklist: Patient identified, Emergency Drugs available, Suction available, Patient being monitored and Timeout performed Preoxygenation: Pre-oxygenation with 100% oxygen Induction Type: IV induction LMA: LMA inserted LMA Size: 4.0 Tube type: Oral Number of attempts: 1 Placement Confirmation: CO2 detector,  positive ETCO2 and breath sounds checked- equal and bilateral Tube secured with: Tape Dental Injury: Teeth and Oropharynx as per pre-operative assessment

## 2016-12-01 NOTE — Anesthesia Preprocedure Evaluation (Signed)
Anesthesia Evaluation  Patient identified by MRN, date of birth, ID band Patient awake    Reviewed: Allergy & Precautions, NPO status , Patient's Chart, lab work & pertinent test results  Airway Mallampati: II  TM Distance: >3 FB Neck ROM: Full    Dental no notable dental hx.    Pulmonary neg pulmonary ROS, former smoker,    Pulmonary exam normal breath sounds clear to auscultation       Cardiovascular negative cardio ROS Normal cardiovascular exam Rhythm:Regular Rate:Normal     Neuro/Psych negative neurological ROS  negative psych ROS   GI/Hepatic negative GI ROS, Neg liver ROS,   Endo/Other  negative endocrine ROS  Renal/GU negative Renal ROS  negative genitourinary   Musculoskeletal negative musculoskeletal ROS (+)   Abdominal   Peds negative pediatric ROS (+)  Hematology negative hematology ROS (+)   Anesthesia Other Findings   Reproductive/Obstetrics negative OB ROS                            Anesthesia Physical Anesthesia Plan  ASA: II  Anesthesia Plan: General   Post-op Pain Management: GA combined w/ Regional for post-op pain   Induction: Intravenous  PONV Risk Score and Plan: 2 and Ondansetron, Dexamethasone, Treatment may vary due to age or medical condition and Midazolam  Airway Management Planned: LMA  Additional Equipment:   Intra-op Plan:   Post-operative Plan: Extubation in OR  Informed Consent:   Plan Discussed with: CRNA and Surgeon  Anesthesia Plan Comments: ( )        Anesthesia Quick Evaluation

## 2016-12-01 NOTE — Progress Notes (Signed)
Assisted Dr. Oddono with right, ultrasound guided, popliteal block. Side rails up, monitors on throughout procedure. See vital signs in flow sheet. Tolerated Procedure well. 

## 2016-12-01 NOTE — H&P (Signed)
Mario Taylor is an 41 y.o. male.   Chief Complaint:  Right forefoot pain HPI:  41 y/o male with traumatic bunion of the right foot after a softball injury   He has failed non op treatment and presents today for surgery.  Past Medical History:  Diagnosis Date  . Allergy   . Hematuria     Past Surgical History:  Procedure Laterality Date  . FOOT SURGERY  1998  . HAND SURGERY Right   . SHOULDER SURGERY Right 2013   x2   . WISDOM TOOTH EXTRACTION      History reviewed. No pertinent family history. Social History:  reports that he quit smoking about 12 years ago. His smoking use included Cigarettes. He has a 2.50 pack-year smoking history. He has never used smokeless tobacco. He reports that he drinks alcohol. He reports that he does not use drugs.  Allergies: No Known Allergies  Medications Prior to Admission  Medication Sig Dispense Refill  . cetirizine (ZYRTEC) 10 MG tablet Take 10 mg by mouth daily as needed for allergies.    . pseudoephedrine (SUDAFED) 30 MG tablet Take 30 mg by mouth every 4 (four) hours as needed for congestion.      No results found for this or any previous visit (from the past 48 hour(s)). No results found.  ROS  No recent f/c/n/v/wt loss  Blood pressure 130/89, pulse 63, temperature 98.1 F (36.7 C), temperature source Oral, resp. rate 18, height 6' (1.829 m), weight 95.2 kg (209 lb 12.8 oz), SpO2 98 %. Physical Exam  wn wd male in nad.  A and o x 4.  Mood and affect normal.  EOMI.  resp unlabored.  R hallux with bunion deformity.  Skin healthy and intact.  No lymphadenopathy.  5/5 strength in PF and DF of the hallux.  Sens to LT intact at the forefoot.  Assessment/Plan R traumatic bunion with plantar plate injury.  The risks and benefits of the alternative treatment options have been discussed in detail.  The patient wishes to proceed with surgery and specifically understands risks of bleeding, infection, nerve damage, blood clots, need for  additional surgery, amputation and death.   Wylene Simmer, MD 12-29-2016, 11:49 AM

## 2016-12-01 NOTE — Anesthesia Procedure Notes (Signed)
Anesthesia Regional Block: Popliteal block   Pre-Anesthetic Checklist: ,, timeout performed, Correct Patient, Correct Site, Correct Laterality, Correct Procedure, Correct Position, site marked, Risks and benefits discussed,  Surgical consent,  Pre-op evaluation,  At surgeon's request and post-op pain management  Laterality: Right  Prep: chloraprep       Needles:  Injection technique: Single-shot  Needle Type: Echogenic Stimulator Needle     Needle Length: 5cm  Needle Gauge: 22     Additional Needles:   Procedures:, nerve stimulator,,, ultrasound used (permanent image in chart),,,,  Narrative:  Start time: 12/01/2016 11:45 AM End time: 12/01/2016 11:53 AM Injection made incrementally with aspirations every 5 mL.  Performed by: Personally  Anesthesiologist: Voris Tigert  Additional Notes: Functioning IV was confirmed and monitors were applied.  A 42mm 22ga Arrow echogenic stimulator needle was used. Sterile prep and drape,hand hygiene and sterile gloves were used. Ultrasound guidance: relevant anatomy identified, needle position confirmed, local anesthetic spread visualized around nerve(s)., vascular puncture avoided.  Image printed for medical record. Negative aspiration and negative test dose prior to incremental administration of local anesthetic. The patient tolerated the procedure well.

## 2016-12-01 NOTE — Anesthesia Postprocedure Evaluation (Signed)
Anesthesia Post Note  Patient: Mario Taylor  Procedure(s) Performed: Right 1st metatarsal SCARF osteotomy; modified McBride bunionectomy (Right Foot) Repair of collateral ligament/plantar plate (Right Foot)     Patient location during evaluation: PACU Anesthesia Type: General Level of consciousness: awake and alert Pain management: pain level controlled Vital Signs Assessment: post-procedure vital signs reviewed and stable Respiratory status: spontaneous breathing, nonlabored ventilation, respiratory function stable and patient connected to nasal cannula oxygen Cardiovascular status: blood pressure returned to baseline and stable Postop Assessment: no apparent nausea or vomiting Anesthetic complications: no    Last Vitals:  Vitals:   12/01/16 1400 12/01/16 1415  BP: 132/83 131/84  Pulse: 64 (!) 58  Resp: (!) 27 16  Temp:    SpO2: 99% 100%    Last Pain:  Vitals:   12/01/16 1415  TempSrc:   PainSc: 0-No pain                 Zadie Deemer

## 2016-12-01 NOTE — Transfer of Care (Signed)
Immediate Anesthesia Transfer of Care Note  Patient: Mario Taylor  Procedure(s) Performed: Right 1st metatarsal SCARF osteotomy; modified McBride bunionectomy (Right Foot) Repair of collateral ligament/plantar plate (Right Foot)  Patient Location: PACU  Anesthesia Type:General  Level of Consciousness: awake, alert  and oriented  Airway & Oxygen Therapy: Patient Spontanous Breathing and Patient connected to face mask oxygen  Post-op Assessment: Report given to RN and Post -op Vital signs reviewed and stable  Post vital signs: Reviewed and stable  Last Vitals:  Vitals:   12/01/16 1145 12/01/16 1150  BP: 122/86 (!) 129/96  Pulse: (!) 57 61  Resp: 11 13  Temp:    SpO2: 100% 100%    Last Pain:  Vitals:   12/01/16 1150  TempSrc:   PainSc: 0-No pain         Complications: No apparent anesthesia complications

## 2016-12-01 NOTE — Discharge Instructions (Signed)
Mario Simmer, MD [place]  Please read the following information regarding your care after surgery.  Medications  You only need a prescription for the narcotic pain medicine (ex. oxycodone, Percocet, Norco).  All of the other medicines listed below are available over the counter. X Aleve 2 pills twice a day for the first 3 days after surgery. X acetominophen (Tylenol) 650 mg every 4-6 hours as you need for minor to moderate pain X oxycodone as prescribed for severe pain  Narcotic pain medicine (ex. oxycodone, Percocet, Vicodin) will cause constipation.  To prevent this problem, take the following medicines while you are taking any pain medicine. X docusate sodium (Colace) 100 mg twice a day X senna (Senokot) 2 tablets twice a day  X To help prevent blood clots, take a baby aspirin (81 mg) twice a day for two weeks after surgery.  You should also get up every hour while you are awake to move around.    Weight Bearing ? Bear weight when you are able on your operated leg or foot. X Bear weight only on your operated foot in the post-op shoe. ? Do not bear any weight on the operated leg or foot.  Cast / Splint / Dressing X Keep your splint, cast or dressing clean and dry.  Dont put anything (coat hanger, pencil, etc) down inside of it.  If it gets damp, use a hair dryer on the cool setting to dry it.  If it gets soaked, call the office to schedule an appointment for a cast change. ? Remove your dressing 3 days after surgery and cover the incisions with dry dressings.    After your dressing, cast or splint is removed; you may shower, but do not soak or scrub the wound.  Allow the water to run over it, and then gently pat it dry.  Swelling It is normal for you to have swelling where you had surgery.  To reduce swelling and pain, keep your toes above your nose for at least 3 days after surgery.  It may be necessary to keep your foot or leg elevated for several weeks.  If it hurts,  it should be elevated.  Follow Up Call my office at [phone] when you are discharged from the hospital or surgery center to schedule an appointment to be seen two weeks after surgery.  Call my office at [phone] if you develop a fever >101.5 F, nausea, vomiting, bleeding from the surgical site or severe pain.     Regional Anesthesia Blocks  1. Numbness or the inability to move the "blocked" extremity may last from 3-48 hours after placement. The length of time depends on the medication injected and your individual response to the medication. If the numbness is not going away after 48 hours, call your surgeon.  2. The extremity that is blocked will need to be protected until the numbness is gone and the  Strength has returned. Because you cannot feel it, you will need to take extra care to avoid injury. Because it may be weak, you may have difficulty moving it or using it. You may not know what position it is in without looking at it while the block is in effect.  3. For blocks in the legs and feet, returning to weight bearing and walking needs to be done carefully. You will need to wait until the numbness is entirely gone and the strength has returned. You should be able to move your leg and foot normally before you try  and bear weight or walk. You will need someone to be with you when you first try to ensure you do not fall and possibly risk injury.  4. Bruising and tenderness at the needle site are common side effects and will resolve in a few days.  5. Persistent numbness or new problems with movement should be communicated to the surgeon or the [facility] [phone] [place] [phone]).    Post Anesthesia Home Care Instructions  Activity: Get plenty of rest for the remainder of the day. A responsible individual must stay with you for 24 hours following the procedure.  For the next 24 hours, DO NOT: -Drive a car -Paediatric nurse -Drink  alcoholic beverages -Take any medication unless instructed by your physician -Make any legal decisions or sign important papers.  Meals: Start with liquid foods such as gelatin or soup. Progress to regular foods as tolerated. Avoid greasy, spicy, heavy foods. If nausea and/or vomiting occur, drink only clear liquids until the nausea and/or vomiting subsides. Call your physician if vomiting continues.  Special Instructions/Symptoms: Your throat may feel dry or sore from the anesthesia or the breathing tube placed in your throat during surgery. If this causes discomfort, gargle with warm salt water. The discomfort should disappear within 24 hours.  If you had a scopolamine patch placed behind your ear for the management of post- operative nausea and/or vomiting:  1. The medication in the patch is effective for 72 hours, after which it should be removed.  Wrap patch in a tissue and discard in the trash. Wash hands thoroughly with soap and water. 2. You may remove the patch earlier than 72 hours if you experience unpleasant side effects which may include dry mouth, dizziness or visual disturbances. 3. Avoid touching the patch. Wash your hands with soap and water after contact with the patch.

## 2016-12-02 ENCOUNTER — Encounter (HOSPITAL_BASED_OUTPATIENT_CLINIC_OR_DEPARTMENT_OTHER): Payer: Self-pay | Admitting: Orthopedic Surgery

## 2016-12-02 NOTE — Addendum Note (Signed)
Addendum  created 12/02/16 3817 by Jaaliyah Lucatero, Ernesta Amble, CRNA   Charge Capture section accepted

## 2016-12-12 DIAGNOSIS — M4003 Postural kyphosis, cervicothoracic region: Secondary | ICD-10-CM | POA: Diagnosis not present

## 2016-12-12 DIAGNOSIS — M9901 Segmental and somatic dysfunction of cervical region: Secondary | ICD-10-CM | POA: Diagnosis not present

## 2016-12-12 DIAGNOSIS — M5387 Other specified dorsopathies, lumbosacral region: Secondary | ICD-10-CM | POA: Diagnosis not present

## 2016-12-21 DIAGNOSIS — M5387 Other specified dorsopathies, lumbosacral region: Secondary | ICD-10-CM | POA: Diagnosis not present

## 2016-12-21 DIAGNOSIS — M9901 Segmental and somatic dysfunction of cervical region: Secondary | ICD-10-CM | POA: Diagnosis not present

## 2016-12-21 DIAGNOSIS — M4003 Postural kyphosis, cervicothoracic region: Secondary | ICD-10-CM | POA: Diagnosis not present

## 2016-12-26 DIAGNOSIS — M21611 Bunion of right foot: Secondary | ICD-10-CM | POA: Diagnosis not present

## 2016-12-28 DIAGNOSIS — J069 Acute upper respiratory infection, unspecified: Secondary | ICD-10-CM | POA: Diagnosis not present

## 2017-01-18 DIAGNOSIS — M21611 Bunion of right foot: Secondary | ICD-10-CM | POA: Diagnosis not present

## 2017-02-15 DIAGNOSIS — M79671 Pain in right foot: Secondary | ICD-10-CM | POA: Diagnosis not present

## 2017-03-22 DIAGNOSIS — J0100 Acute maxillary sinusitis, unspecified: Secondary | ICD-10-CM | POA: Diagnosis not present

## 2017-05-08 DIAGNOSIS — H9209 Otalgia, unspecified ear: Secondary | ICD-10-CM | POA: Diagnosis not present

## 2017-06-06 DIAGNOSIS — M542 Cervicalgia: Secondary | ICD-10-CM | POA: Diagnosis not present

## 2017-06-20 DIAGNOSIS — D2261 Melanocytic nevi of right upper limb, including shoulder: Secondary | ICD-10-CM | POA: Diagnosis not present

## 2017-06-20 DIAGNOSIS — D225 Melanocytic nevi of trunk: Secondary | ICD-10-CM | POA: Diagnosis not present

## 2017-06-20 DIAGNOSIS — D2372 Other benign neoplasm of skin of left lower limb, including hip: Secondary | ICD-10-CM | POA: Diagnosis not present

## 2017-08-04 DIAGNOSIS — M79645 Pain in left finger(s): Secondary | ICD-10-CM | POA: Diagnosis not present

## 2017-10-12 DIAGNOSIS — M79641 Pain in right hand: Secondary | ICD-10-CM | POA: Diagnosis not present

## 2017-10-20 DIAGNOSIS — K1379 Other lesions of oral mucosa: Secondary | ICD-10-CM | POA: Diagnosis not present

## 2017-12-20 IMAGING — DX DG CHEST 2V
2 series · 2 of 2 positions shown · non-contrast
Comparison: None.

CLINICAL DATA: Short of breath

EXAM:
CHEST  2 VIEW

[chest pa]
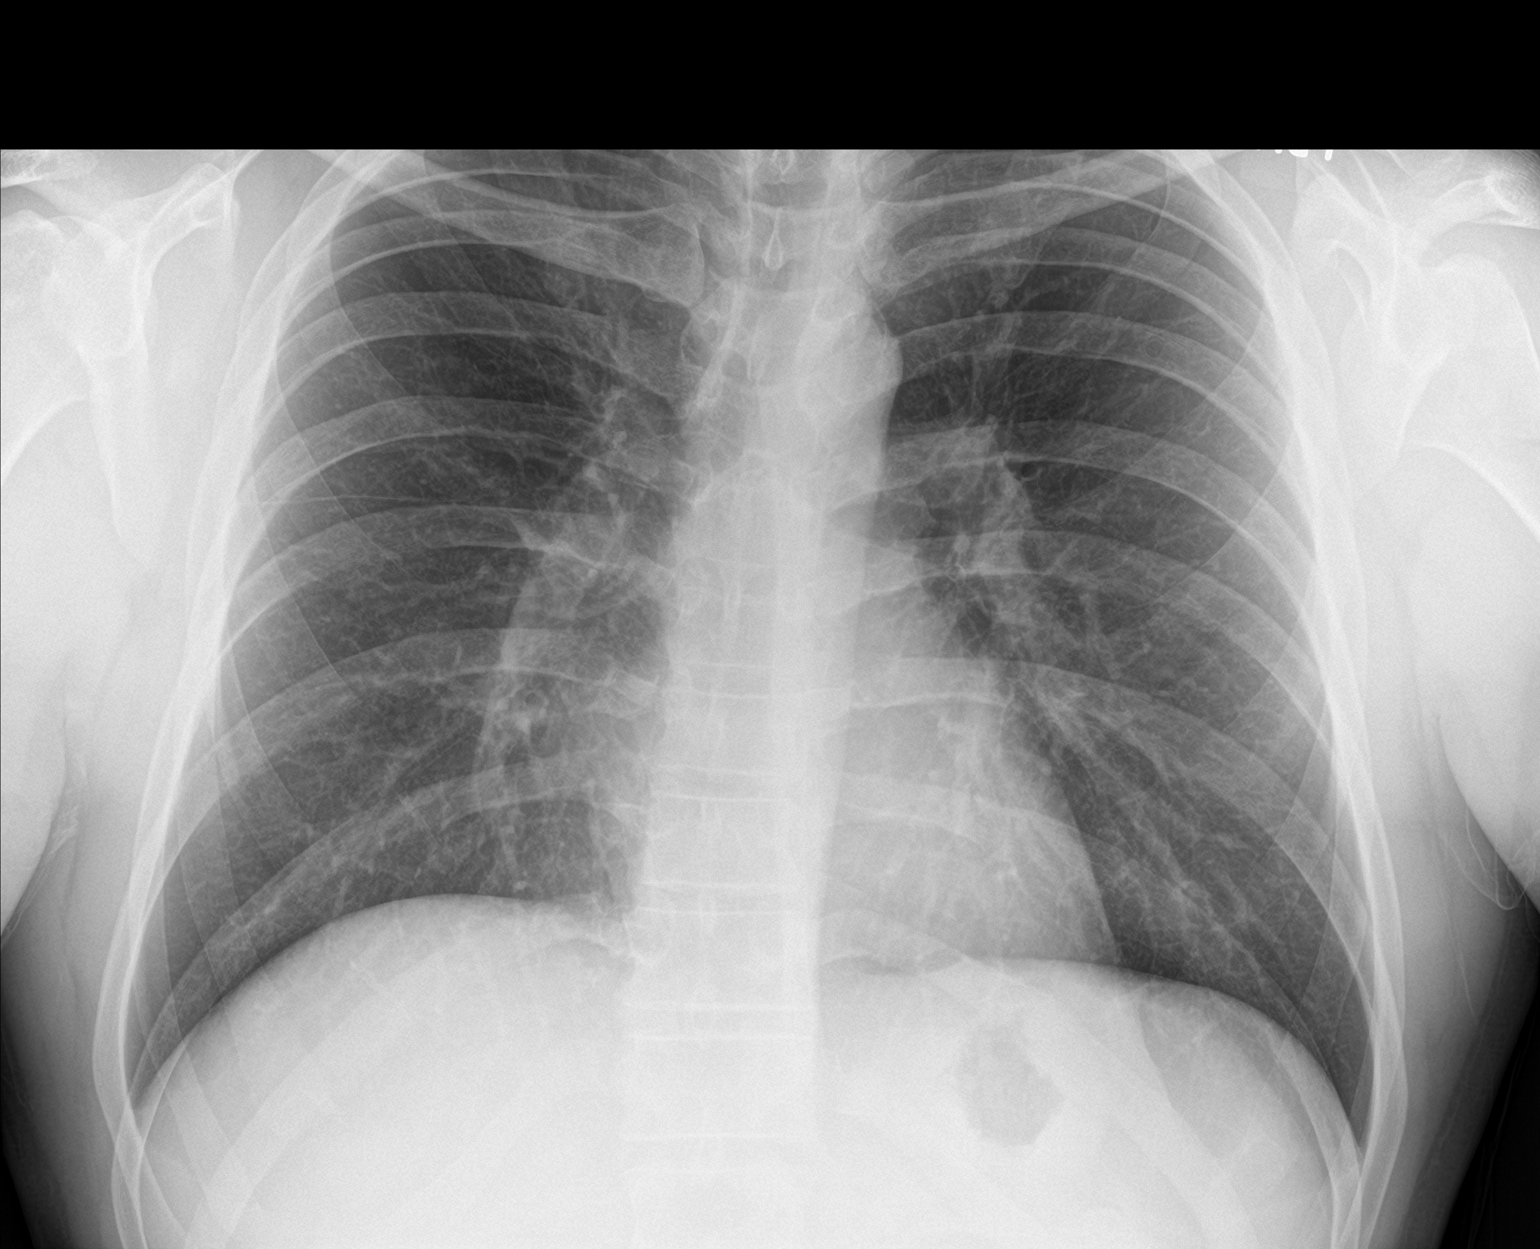

[chest lat]
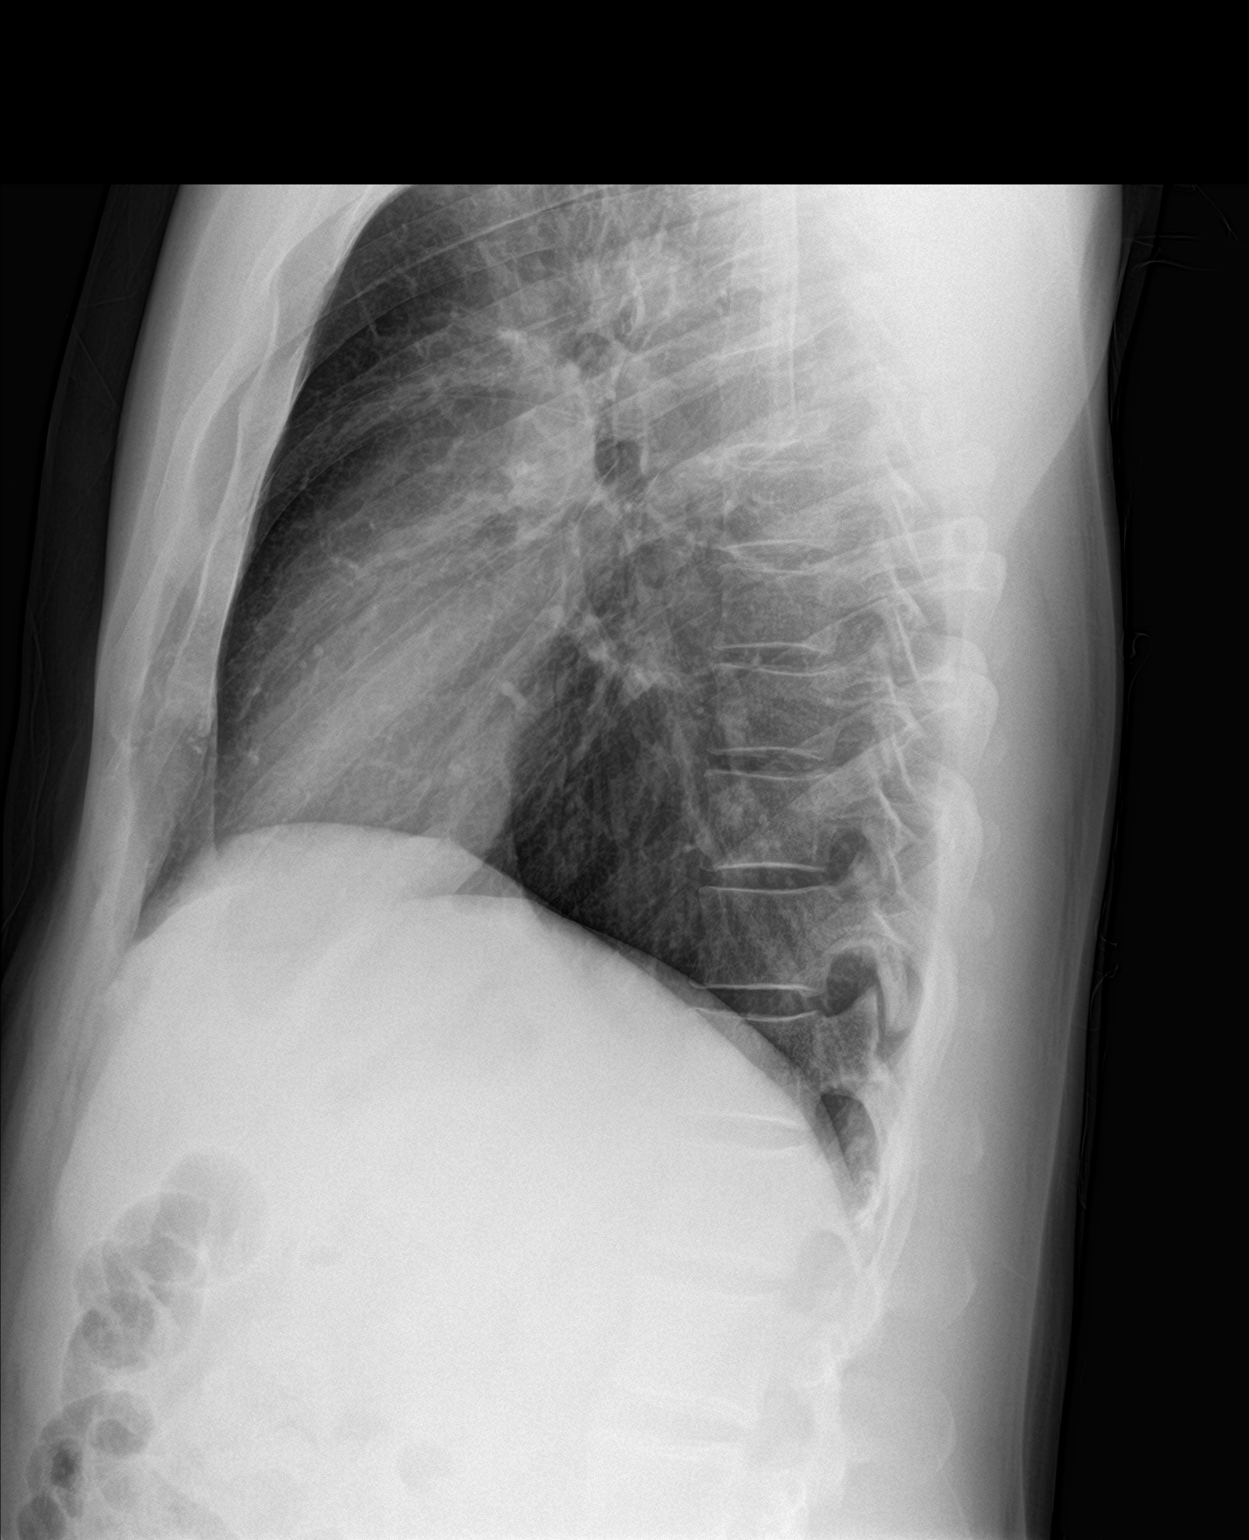

[2 of 2 positions shown; findings below may reference images not displayed]

FINDINGS: The heart size and mediastinal contours are within normal limits.
Both lungs are clear. The visualized skeletal structures are
unremarkable.
IMPRESSION: No active cardiopulmonary disease.

## 2018-03-23 DIAGNOSIS — J0100 Acute maxillary sinusitis, unspecified: Secondary | ICD-10-CM | POA: Diagnosis not present

## 2018-03-23 DIAGNOSIS — J0110 Acute frontal sinusitis, unspecified: Secondary | ICD-10-CM | POA: Diagnosis not present

## 2018-03-23 DIAGNOSIS — J0120 Acute ethmoidal sinusitis, unspecified: Secondary | ICD-10-CM | POA: Diagnosis not present

## 2018-04-10 DIAGNOSIS — J029 Acute pharyngitis, unspecified: Secondary | ICD-10-CM | POA: Diagnosis not present

## 2018-04-10 DIAGNOSIS — Z5181 Encounter for therapeutic drug level monitoring: Secondary | ICD-10-CM | POA: Diagnosis not present

## 2018-04-10 DIAGNOSIS — B351 Tinea unguium: Secondary | ICD-10-CM | POA: Diagnosis not present
